# Patient Record
Sex: Male | Born: 1990 | Hispanic: Yes | Marital: Single | Smoking: Never smoker
Health system: Southern US, Community
[De-identification: ages and names within clinical notes are randomized; demographics above are authoritative.]

---
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
## 2012-11-29 ENCOUNTER — Other Ambulatory Visit (HOSPITAL_COMMUNITY): Payer: Self-pay

## 2016-01-05 ENCOUNTER — Encounter (HOSPITAL_COMMUNITY): Payer: Self-pay | Admitting: *Deleted

## 2016-01-05 ENCOUNTER — Emergency Department (HOSPITAL_COMMUNITY)
Admission: EM | Admit: 2016-01-05 | Discharge: 2016-01-05 | Disposition: A | Discharge status: Home / Self Care | Payer: Medicaid Other | Source: Home / Self Care | Attending: Family Medicine | Admitting: Family Medicine

## 2016-01-05 DIAGNOSIS — K5900 Constipation, unspecified: Secondary | ICD-10-CM

## 2016-01-05 DIAGNOSIS — K5909 Other constipation: Secondary | ICD-10-CM

## 2016-01-05 MED ORDER — PEG 3350-KCL-NABCB-NACL-NASULF 236 G PO SOLR: 4000.0000 mL | Freq: Once | ORAL | Status: AC

## 2016-01-05 NOTE — Discharge Instructions (Signed)
Use medicine as instructed and see specialist if further problems.

## 2016-01-05 NOTE — ED Provider Notes (Signed)
CSN: [id number]     Arrival date & time 01/05/16  1338 History   First MD Initiated Contact with Patient 01/05/16 1518     Chief Complaint  Patient presents with  . Constipation   (Consider location/radiation/quality/duration/timing/severity/associated sxs/prior Treatment) Patient is a 25 y.o. male presenting with constipation. The history is provided by the patient.  Constipation Severity:  Moderate Time since last bowel movement:  3 days Progression:  Unchanged Chronicity:  Chronic Context comment:  Reports lifelong problem and also familial, no relief with miralax. Stool description:  Small Worsened by:  Diet changes Associated symptoms: no abdominal pain, no diarrhea, no fever, no nausea and no vomiting   Risk factors: obesity     History reviewed. No pertinent past medical history. History reviewed. No pertinent past surgical history. History reviewed. No pertinent family history. Social History  Substance Use Topics  . Smoking status: None  . Smokeless tobacco: None  . Alcohol Use: No    Review of Systems  Constitutional: Negative.  Negative for fever.  HENT: Negative.   Cardiovascular: Negative.   Gastrointestinal: Positive for constipation. Negative for nausea, vomiting, abdominal pain, diarrhea, blood in stool and rectal pain.  All other systems reviewed and are negative.   Allergies  Banana  Home Medications   Prior to Admission medications   Medication Sig Start Date End Date Taking? Authorizing Provider  polyethylene glycol (GOLYTELY) 236 g solution Take 4,000 mLs by mouth once. 01/05/16   Linna Hoff, MD   Meds Ordered and Administered this Visit  Medications - No data to display  BP 136/89 mmHg  Pulse 67  Temp(Src) 98.2 F (36.8 C) (Oral)  Resp 18  SpO2 98% No data found.   Physical Exam  Constitutional: He is oriented to person, place, and time. He appears well-developed and well-nourished. No distress.  HENT:  Mouth/Throat: Oropharynx is  clear and moist.  Neck: Normal range of motion. Neck supple.  Cardiovascular: Normal heart sounds.   Pulmonary/Chest: Effort normal and breath sounds normal.  Abdominal: Soft. Bowel sounds are normal. He exhibits no distension and no mass. There is no tenderness. There is no rebound and no guarding.  Lymphadenopathy:    He has no cervical adenopathy.  Neurological: He is alert and oriented to person, place, and time.  Skin: Skin is warm and dry.  Nursing note and vitals reviewed.   ED Course  Procedures (including critical care time)  Labs Review Labs Reviewed - No data to display  Imaging Review No results found.   Visual Acuity Review  Right Eye Distance:   Left Eye Distance:   Bilateral Distance:    Right Eye Near:   Left Eye Near:    Bilateral Near:         MDM   1. Chronic constipation        Linna Hoff, MD 01/07/16 2000

## 2016-01-05 NOTE — ED Notes (Signed)
Pt  Has   Symptoms  Of  constipation   Hard  bm  X  3 days     Family  History  As   Well           No  Vomiting    No  Acute  /  Severe  Distress

## 2020-08-03 ENCOUNTER — Emergency Department (HOSPITAL_COMMUNITY)
Admission: EM | Admit: 2020-08-03 | Discharge: 2020-08-03 | Disposition: A | Discharge status: Home / Self Care | Payer: HRSA Program | Attending: Emergency Medicine | Admitting: Emergency Medicine

## 2020-08-03 ENCOUNTER — Encounter (HOSPITAL_COMMUNITY): Payer: Self-pay | Admitting: *Deleted

## 2020-08-03 ENCOUNTER — Other Ambulatory Visit: Payer: Self-pay

## 2020-08-03 ENCOUNTER — Emergency Department (HOSPITAL_COMMUNITY): Payer: HRSA Program

## 2020-08-03 DIAGNOSIS — U071 COVID-19: Secondary | ICD-10-CM | POA: Insufficient documentation

## 2020-08-03 DIAGNOSIS — R05 Cough: Secondary | ICD-10-CM | POA: Diagnosis present

## 2020-08-03 DIAGNOSIS — R000 Tachycardia, unspecified: Secondary | ICD-10-CM | POA: Insufficient documentation

## 2020-08-03 DIAGNOSIS — Z91040 Latex allergy status: Secondary | ICD-10-CM | POA: Insufficient documentation

## 2020-08-03 LAB — CBC WITH DIFFERENTIAL/PLATELET
Abs Immature Granulocytes: 0.02 10*3/uL (ref 0.00–0.07)
Basophils Absolute: 0 10*3/uL (ref 0.0–0.1)
Basophils Relative: 0 %
Eosinophils Absolute: 0 10*3/uL (ref 0.0–0.5)
Eosinophils Relative: 0 %
HCT: 45.5 % (ref 39.0–52.0)
Hemoglobin: 15.2 g/dL (ref 13.0–17.0)
Immature Granulocytes: 0 %
Lymphocytes Relative: 22 %
Lymphs Abs: 1.2 10*3/uL (ref 0.7–4.0)
MCH: 28.4 pg (ref 26.0–34.0)
MCHC: 33.4 g/dL (ref 30.0–36.0)
MCV: 84.9 fL (ref 80.0–100.0)
Monocytes Absolute: 0.4 10*3/uL (ref 0.1–1.0)
Monocytes Relative: 8 %
Neutro Abs: 3.7 10*3/uL (ref 1.7–7.7)
Neutrophils Relative %: 70 %
Platelets: 252 10*3/uL (ref 150–400)
RBC: 5.36 MIL/uL (ref 4.22–5.81)
RDW: 12.8 % (ref 11.5–15.5)
WBC: 5.3 10*3/uL (ref 4.0–10.5)
nRBC: 0 % (ref 0.0–0.2)

## 2020-08-03 LAB — COMPREHENSIVE METABOLIC PANEL
ALT: 38 U/L (ref 0–44)
AST: 54 U/L — ABNORMAL HIGH (ref 15–41)
Albumin: 3.6 g/dL (ref 3.5–5.0)
Alkaline Phosphatase: 52 U/L (ref 38–126)
Anion gap: 13 (ref 5–15)
BUN: 10 mg/dL (ref 6–20)
CO2: 22 mmol/L (ref 22–32)
Calcium: 8.9 mg/dL (ref 8.9–10.3)
Chloride: 96 mmol/L — ABNORMAL LOW (ref 98–111)
Creatinine, Ser: 1.16 mg/dL (ref 0.61–1.24)
GFR calc Af Amer: >60 mL/min (ref >60)
GFR calc non Af Amer: >60 mL/min (ref >60)
Glucose, Bld: 112 mg/dL — ABNORMAL HIGH (ref 70–99)
Potassium: 3.9 mmol/L (ref 3.5–5.1)
Sodium: 131 mmol/L — ABNORMAL LOW (ref 135–145)
Total Bilirubin: 0.8 mg/dL (ref 0.3–1.2)
Total Protein: 7.8 g/dL (ref 6.5–8.1)

## 2020-08-03 LAB — SARS CORONAVIRUS 2 BY RT PCR (HOSPITAL ORDER, PERFORMED IN ~~LOC~~ HOSPITAL LAB): SARS Coronavirus 2: POSITIVE — AB

## 2020-08-03 LAB — LACTIC ACID, PLASMA: Lactic Acid, Venous: 0.9 mmol/L (ref 0.5–1.9)

## 2020-08-03 MED ORDER — DIPHENHYDRAMINE HCL 50 MG/ML IJ SOLN: 50.0000 mg | Freq: Once | INTRAMUSCULAR | Status: DC | PRN

## 2020-08-03 MED ORDER — SODIUM CHLORIDE 0.9 % IV BOLUS
1000.0000 mL | Freq: Once | INTRAVENOUS | Status: AC
  Administered 2020-08-03: 1000 mL via INTRAVENOUS

## 2020-08-03 MED ORDER — ONDANSETRON HCL 4 MG/2ML IJ SOLN
4.0000 mg | Freq: Once | INTRAMUSCULAR | Status: AC
  Administered 2020-08-03: 4 mg via INTRAVENOUS
  Filled 2020-08-03: qty 2

## 2020-08-03 MED ORDER — EPINEPHRINE 0.3 MG/0.3ML IJ SOAJ: 0.3000 mg | Freq: Once | INTRAMUSCULAR | Status: DC | PRN

## 2020-08-03 MED ORDER — CASIRIVIMAB-IMDEVIMAB 600-600 MG/10ML IJ SOLN
1200.0000 mg | Freq: Once | INTRAMUSCULAR | Status: AC
  Administered 2020-08-03: 1200 mg via INTRAVENOUS
  Filled 2020-08-03: qty 10

## 2020-08-03 MED ORDER — FAMOTIDINE IN NACL 20-0.9 MG/50ML-% IV SOLN: 20.0000 mg | Freq: Once | INTRAVENOUS | Status: DC | PRN

## 2020-08-03 MED ORDER — ACETAMINOPHEN 325 MG PO TABS
650.0000 mg | ORAL_TABLET | Freq: Once | ORAL | Status: AC | PRN
  Administered 2020-08-03: 650 mg via ORAL
  Filled 2020-08-03: qty 2

## 2020-08-03 MED ORDER — METHYLPREDNISOLONE SODIUM SUCC 125 MG IJ SOLR: 125.0000 mg | Freq: Once | INTRAMUSCULAR | Status: DC | PRN

## 2020-08-03 MED ORDER — SODIUM CHLORIDE 0.9 % IV SOLN: INTRAVENOUS | Status: DC | PRN

## 2020-08-03 MED ORDER — ALBUTEROL SULFATE HFA 108 (90 BASE) MCG/ACT IN AERS: 2.0000 | INHALATION_SPRAY | Freq: Once | RESPIRATORY_TRACT | Status: DC | PRN

## 2020-08-03 NOTE — ED Notes (Signed)
Patient verbalizes understanding of discharge instructions. Opportunity for questioning and answers were provided. Armband removed by staff. Patient discharged from ED.  

## 2020-08-03 NOTE — ED Notes (Signed)
Pt ambulated in room with continuous O2 monitoring. Upon standing and walking pt O2 sat went from 96% to 99-100% and maintained 98-100% throughout ambulation.

## 2020-08-03 NOTE — ED Triage Notes (Signed)
Pt states he started feeling bad last Monday and Tuesday, then was in bed Wednesday-Friday with fever.  Pt has cough, fever, weakness, and body aches.

## 2020-08-03 NOTE — ED Provider Notes (Addendum)
[facility] EMERGENCY DEPARTMENT Provider Note   CSN: [id number] Arrival date & time: 08/03/20  1007     History Chief Complaint  Patient presents with  . Covid/ Body aches  . Cough  . Weakness    Jimmy Banks is a 29 y.o. male.  Patient with no significant past medical history presents to the emergency department today for cough, fever, generalized body aches, vomiting, and diarrhea.  Patient works in an [place].  He states that he started feeling poorly on August 9.  Patient reports severe fatigue and fevers.  Temperature on arrival of 103.2 F.  He reports occasional vomiting despite drinking Pedialyte at home.  He has been taking TheraFlu as well.  Occasional episodes of diarrhea without significant abdominal pain.  He states that his breathing feels "excellent" and does not endorse significant shortness of breath.  No urinary symptoms.  He denies known sick contacts.  He has not received coronavirus vaccine.        History reviewed. No pertinent past medical history.  There are no problems to display for this patient.   History reviewed. No pertinent surgical history.     No family history on file.  Social History   Tobacco Use  . Smoking status: Never Smoker  . Smokeless tobacco: Never Used  Substance Use Topics  . Alcohol use: No  . Drug use: Not Currently    Home Medications Prior to Admission medications   Medication Sig Start Date End Date Taking? Authorizing Provider  polyethylene glycol (GOLYTELY) 236 g solution Take 4,000 mLs by mouth once. 01/05/16   Linna Hoff, MD    Allergies    Banana and Latex  Review of Systems   Review of Systems  Constitutional: Positive for chills, fatigue and fever.  HENT: Negative for rhinorrhea and sore throat.   Eyes: Negative for redness.  Respiratory: Positive for cough. Negative for shortness of breath.   Cardiovascular: Negative for chest pain.  Gastrointestinal: Positive for diarrhea,  nausea and vomiting. Negative for abdominal pain.  Genitourinary: Negative for decreased urine volume, dysuria and hematuria.  Musculoskeletal: Positive for myalgias.  Skin: Negative for rash.  Neurological: Negative for headaches.    Physical Exam Updated Vital Signs BP 133/90   Pulse (!) 104   Temp (!) 101.7 F (38.7 C) (Oral)   Resp 18   Ht 6' (1.829 m)   Wt 113.4 kg   SpO2 97%   BMI 33.91 kg/m   Physical Exam Vitals and nursing note reviewed.  Constitutional:      Appearance: He is well-developed.  HENT:     Head: Normocephalic and atraumatic.     Right Ear: External ear normal.     Left Ear: External ear normal.     Nose: Nose normal. No congestion.     Mouth/Throat:     Mouth: Mucous membranes are dry.  Eyes:     General:        Right eye: No discharge.        Left eye: No discharge.     Conjunctiva/sclera: Conjunctivae normal.  Cardiovascular:     Rate and Rhythm: Regular rhythm. Tachycardia present.     Heart sounds: Normal heart sounds.     Comments: Mild tachycardia Pulmonary:     Effort: Pulmonary effort is normal.     Breath sounds: Rales (Minimal, scattered) present.  Abdominal:     Palpations: Abdomen is soft.     Tenderness: There is no abdominal tenderness.  There is no guarding or rebound.  Musculoskeletal:        General: Normal range of motion.     Cervical back: Normal range of motion and neck supple.     Right lower leg: No edema.     Left lower leg: No edema.  Skin:    General: Skin is warm and dry.  Neurological:     Mental Status: He is alert.     ED Results / Procedures / Treatments   Labs (all labs ordered are listed, but only abnormal results are displayed) Labs Reviewed  SARS CORONAVIRUS 2 BY RT PCR (HOSPITAL ORDER, PERFORMED IN [facility]) - Abnormal; Notable for the following components:      Result Value   SARS Coronavirus 2 POSITIVE (*)    All other components within normal limits  COMPREHENSIVE METABOLIC  PANEL - Abnormal; Notable for the following components:   Sodium 131 (*)    Chloride 96 (*)    Glucose, Bld 112 (*)    AST 54 (*)    All other components within normal limits  LACTIC ACID, PLASMA  CBC WITH DIFFERENTIAL/PLATELET  LACTIC ACID, PLASMA  URINALYSIS, ROUTINE W REFLEX MICROSCOPIC    EKG None  Radiology DG Chest Portable 1 View  Result Date: 08/03/2020 CLINICAL DATA:  Shortness of breath.  Fever. EXAM: PORTABLE CHEST 1 VIEW COMPARISON:  No prior. FINDINGS: Mediastinum hilar structures normal. Low lung volumes. Mild bibasilar atelectasis and infiltrates. No pleural effusion or pneumothorax. Mild thoracic spine scoliosis concave left. IMPRESSION: Low lung volumes with mild bibasilar atelectasis and infiltrates. Bibasilar pneumonia cannot be excluded. Electronically Signed   By: Maisie Fus  Register   On: 08/03/2020 13:01    Procedures Procedures (including critical care time)  Medications Ordered in ED Medications  casirivimab-imdevimab (REGEN-COV) 1,200 mg in sodium chloride 0.9 % 110 mL IVPB (1,200 mg Intravenous New Bag/Given 08/03/20 1458)  0.9 %  sodium chloride infusion (has no administration in time range)  diphenhydrAMINE (BENADRYL) injection 50 mg (has no administration in time range)  famotidine (PEPCID) IVPB 20 mg premix (has no administration in time range)  methylPREDNISolone sodium succinate (SOLU-MEDROL) 125 mg/2 mL injection 125 mg (has no administration in time range)  albuterol (VENTOLIN HFA) 108 (90 Base) MCG/ACT inhaler 2 puff (has no administration in time range)  EPINEPHrine (EPI-PEN) injection 0.3 mg (has no administration in time range)  acetaminophen (TYLENOL) tablet 650 mg (650 mg Oral Given 08/03/20 1045)  sodium chloride 0.9 % bolus 1,000 mL (1,000 mLs Intravenous New Bag/Given 08/03/20 1410)    ED Course  I have reviewed the triage vital signs and the nursing notes.  Pertinent labs & imaging results that were available during my care of the patient  were reviewed by me and considered in my medical decision making (see chart for details).  Patient seen and examined. Pt is COVID-19 positive. Neg vaccine.  Patient's oxygen saturation is 97% on room air.  Will ambulate.  Patient is a candidate for monoclonal antibodies given BMI of 33.9.  I discussed risks and benefits of this treatment with the patient at bedside.  He would like to proceed.  Patient will be given IV fluid bolus.  Anticipate discharge to home.  Vital signs reviewed and are as follows: BP 133/90   Pulse (!) 104   Temp (!) 101.7 F (38.7 C) (Oral)   Resp 18   Ht 6' (1.829 m)   Wt 113.4 kg   SpO2 97%   BMI  33.91 kg/m   3:12 PM patient rechecked.  On the phone in her room, no distress.  He is receiving monoclonal antibody infusion without complications.  Signout Jimmy Banks at shift change.   Plan: Suspect patient will be stable for discharge to home.  He will need ambulated prior and given PO challenge.   Jimmy Banks was evaluated in Emergency Department on 08/03/2020 for the symptoms described in the history of present illness. He was evaluated in the context of the global COVID-19 pandemic, which necessitated consideration that the patient might be at risk for infection with the SARS-CoV-2 virus that causes COVID-19. Institutional protocols and algorithms that pertain to the evaluation of patients at risk for COVID-19 are in a state of rapid change based on information released by regulatory bodies including the CDC and federal and state organizations. These policies and algorithms were followed during the patient's care in the ED.    MDM Rules/Calculators/A&P                          COVID-19, mild pna suspected.  No hypoxia.  No respiratory distress.  He is receiving monoclonal antibody treatment.  Final Clinical Impression(s) / ED Diagnoses Final diagnoses:  COVID-19    Rx / DC Orders ED Discharge Orders    None        Renne Crigler, Banks 08/03/20  1523    Sabas Sous, MD 08/03/20 1534

## 2020-08-03 NOTE — Discharge Instructions (Signed)
Please read and follow all provided instructions.  Your diagnoses today include:  1. COVID-19     Tests performed today include: Chest x-ray - possible mild pneumonia Blood counts and electrolytes COVID test - was positive Vital signs. See below for your results today.   Medications prescribed:  None  Home care instructions:  Follow any educational materials contained in this packet.  Isolate yourself for another 7 days and do not break isolation until you have a least 24 hours of no fever and improvement in your symptoms.  Follow-up instructions: Please follow-up with the post-COVID clinic as listed.   Return instructions:  Please return to the Emergency Department if you experience worsening symptoms.  Return if you have worsening shortness of breath or difficulty breathing, persistent vomiting, severe chest pain. Please return if you have any other emergent concerns.  Additional Information:  Your vital signs today were: BP (!) 148/101    Pulse 100    Temp 98.8 F (37.1 C) (Oral)    Resp (!) 25    Ht 6' (1.829 m)    Wt 113.4 kg    SpO2 96%    BMI 33.91 kg/m  If your blood pressure (BP) was elevated above 135/85 this visit, please have this repeated by your doctor within one month. --------------

## 2020-08-03 NOTE — ED Provider Notes (Addendum)
29 year old presents for weakness, cough and myalgias. COVID pos here. See HPI from previous provider, Ge[place], PA-C   COVID pos, getting MAB here, Ambulate with pulse Ox, getting IVF, tolerating PO intake. No SOB, Dypnea on exhertion   Likely dc home  Physical Exam  BP 132/89   Pulse (!) 92   Temp 99 F (37.2 C) (Oral)   Resp (!) 24   Ht 6' (1.829 m)   Wt 113.4 kg   SpO2 94%   BMI 33.91 kg/m   Physical Exam Vitals and nursing note reviewed.  Constitutional:      General: He is not in acute distress.    Appearance: He is well-developed. He is not ill-appearing, toxic-appearing or diaphoretic.  HENT:     Head: Normocephalic and atraumatic.     Nose: Nose normal.     Mouth/Throat:     Mouth: Mucous membranes are moist.  Eyes:     Pupils: Pupils are equal, round, and reactive to light.  Cardiovascular:     Rate and Rhythm: Normal rate and regular rhythm.     Pulses: Normal pulses.     Heart sounds: Normal heart sounds.  Pulmonary:     Effort: Pulmonary effort is normal. No respiratory distress.     Breath sounds: Normal breath sounds.     Comments: Clear to auscultation bilateral without wheeze, rhonchi or rales.  Speaks in full sentences without difficulty Abdominal:     General: Bowel sounds are normal. There is no distension.     Palpations: Abdomen is soft.  Musculoskeletal:        General: Normal range of motion.     Cervical back: Normal range of motion and neck supple.     Comments: Compartments soft.  No bony tenderness.  Denna Haggard' sign negative  Skin:    General: Skin is warm and dry.     Capillary Refill: Capillary refill takes less than 2 seconds.  Neurological:     General: No focal deficit present.     Mental Status: He is alert and oriented to person, place, and time.     Cranial Nerves: No cranial nerve deficit.     Sensory: No sensory deficit.     Motor: No weakness.     Gait: Gait normal.    ED Course/Procedures     Procedures Labs Reviewed   SARS CORONAVIRUS 2 BY RT PCR (HOSPITAL ORDER, PERFORMED IN [facility]) - Abnormal; Notable for the following components:      Result Value   SARS Coronavirus 2 POSITIVE (*)    All other components within normal limits  COMPREHENSIVE METABOLIC PANEL - Abnormal; Notable for the following components:   Sodium 131 (*)    Chloride 96 (*)    Glucose, Bld 112 (*)    AST 54 (*)    All other components within normal limits  LACTIC ACID, PLASMA  CBC WITH DIFFERENTIAL/PLATELET  LACTIC ACID, PLASMA  URINALYSIS, ROUTINE W REFLEX MICROSCOPIC  DG Chest Portable 1 View  Result Date: 08/03/2020 CLINICAL DATA:  Shortness of breath.  Fever. EXAM: PORTABLE CHEST 1 VIEW COMPARISON:  No prior. FINDINGS: Mediastinum hilar structures normal. Low lung volumes. Mild bibasilar atelectasis and infiltrates. No pleural effusion or pneumothorax. Mild thoracic spine scoliosis concave left. IMPRESSION: Low lung volumes with mild bibasilar atelectasis and infiltrates. Bibasilar pneumonia cannot be excluded. Electronically Signed   By: Maisie Fus  Register   On: 08/03/2020 13:01   MDM  Care transferred from previous  provider.  In summation 29 year old who presented for weakness, cough and myalgias.  Did not receive Covid vaccine.  He is Covid positive here.  Has had some nausea and NBNB emesis at home however benign abdominal exam.  Labs personally reviewed and interpreted without significant findings.  Plan to ambulate with pulse ox, he is getting MAB here.  Plan to p.o. challenge.  Likely DC home if ambulatory that hypoxia and tolerating p.o. intake without difficulty.  Obs till 1630 after Infusion  Ambulatory without hypoxia  Patient reassessed. Nauseous, dry heaving will give Zofran  Patient reassessed.  Tolerating p.o. intake without difficulty. Will dc home with symptomatic management.   Can follow up outpatient. No reaction to MAB infusion.  The patient has been appropriately medically screened  and/or stabilized in the ED. I have low suspicion for any other emergent medical condition which would require further screening, evaluation or treatment in the ED or require inpatient management.  Patient is hemodynamically stable and in no acute distress.  Patient able to ambulate in department prior to ED.  Evaluation does not show acute pathology that would require ongoing or additional emergent interventions while in the emergency department or further inpatient treatment.  I have discussed the diagnosis with the patient and answered all questions.  Pain is been managed while in the emergency department and patient has no further complaints prior to discharge.  Patient is comfortable with plan discussed in room and is stable for discharge at this time.  I have discussed strict return precautions for returning to the emergency department.  Patient was encouraged to follow-up with PCP/specialist refer to at discharge.  Jimmy Banks was evaluated in Emergency Department on 08/03/2020 for the symptoms described in the history of present illness. He was evaluated in the context of the global COVID-19 pandemic, which necessitated consideration that the patient might be at risk for infection with the SARS-CoV-2 virus that causes COVID-19. Institutional protocols and algorithms that pertain to the evaluation of patients at risk for COVID-19 are in a state of rapid change based on information released by regulatory bodies including the CDC and federal and state organizations. These policies and algorithms were followed during the patient's care in the ED.       Ilee Randleman A, PA-C 08/03/20 1849    Spenser Cong A, PA-C 08/03/20 1850    Charlynne Pander, MD 08/03/20 2216

## 2020-08-06 ENCOUNTER — Telehealth: Payer: Self-pay | Admitting: Nurse Practitioner

## 2020-08-06 NOTE — Telephone Encounter (Signed)
Attempted to contact patient for appointment at [facility]. No answer, no option to leave voicemail.

## 2021-06-20 IMAGING — DX DG CHEST 1V PORT
1 series · 1 of 1 positions shown · non-contrast
Comparison: No prior.

CLINICAL DATA: Shortness of breath.  Fever.

EXAM:
PORTABLE CHEST 1 VIEW

[chest]
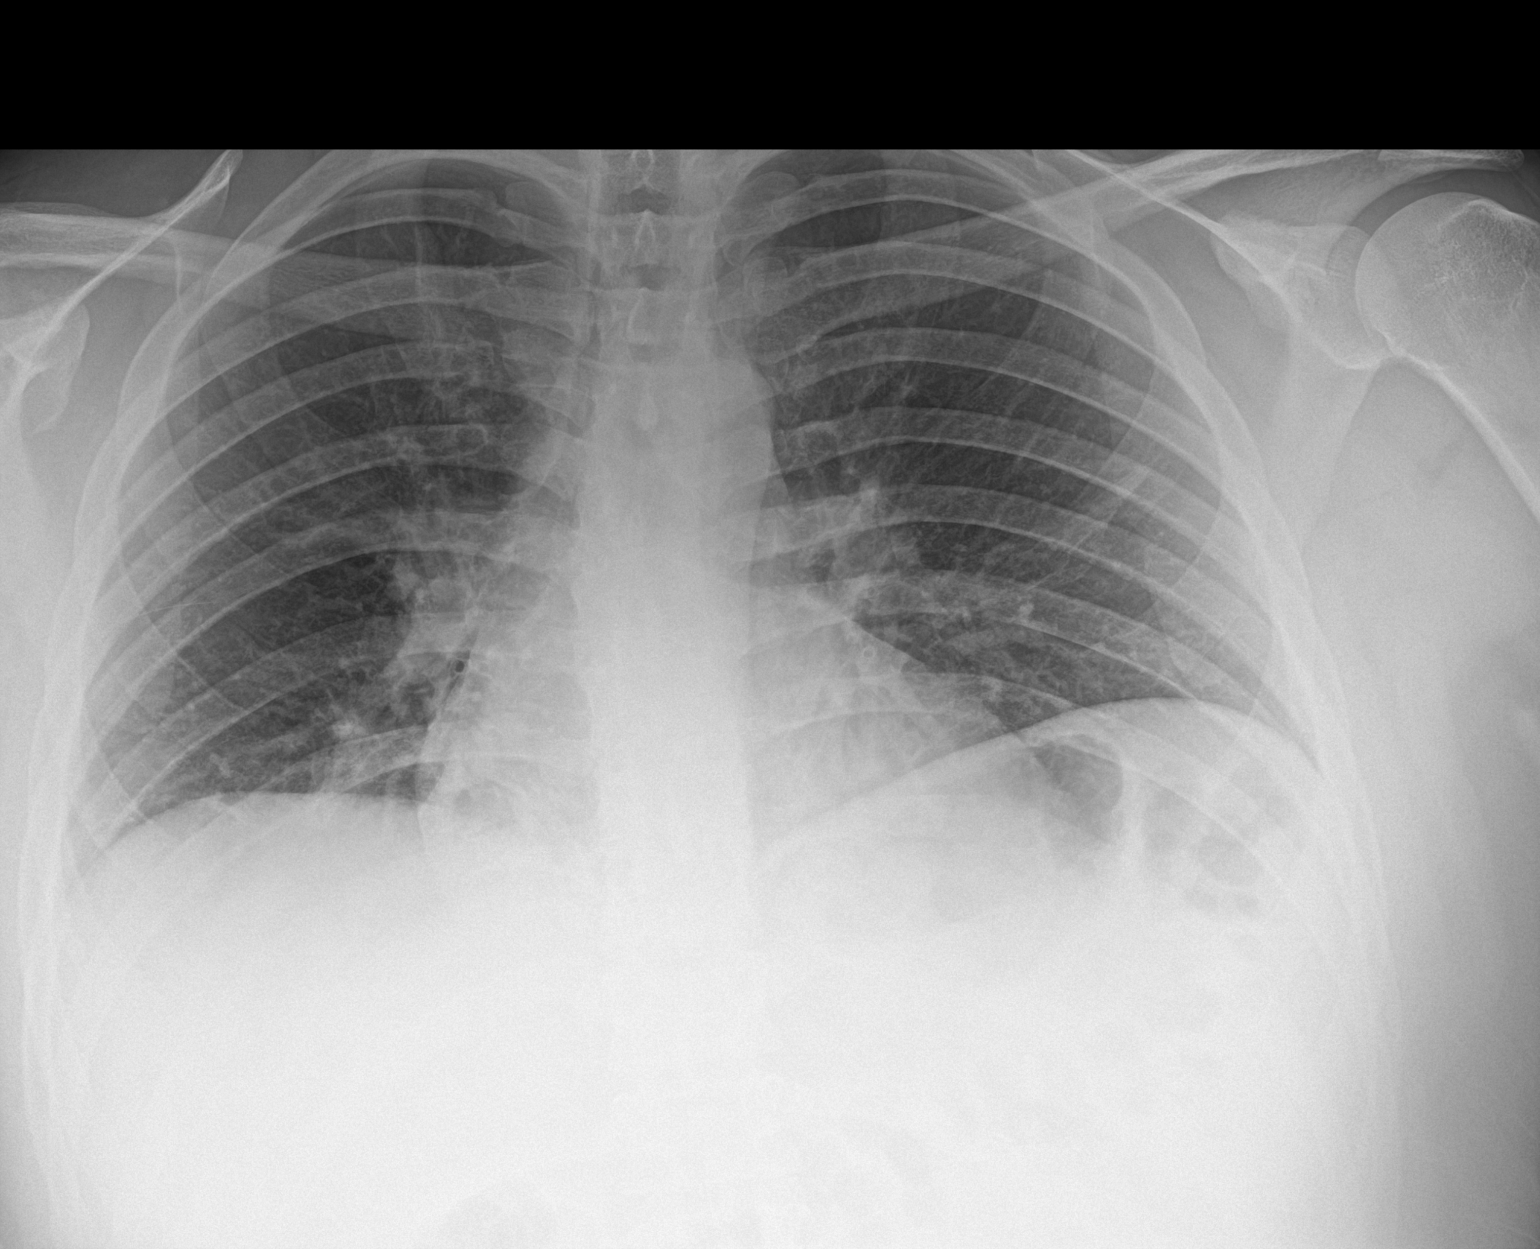

[1 of 1 positions shown; findings below may reference images not displayed]

FINDINGS: Mediastinum hilar structures normal. Low lung volumes. Mild
bibasilar atelectasis and infiltrates. No pleural effusion or
pneumothorax. Mild thoracic spine scoliosis concave left.
IMPRESSION: Low lung volumes with mild bibasilar atelectasis and infiltrates.
Bibasilar pneumonia cannot be excluded.
